# Patient Record
Sex: Female | Born: 1935 | Race: White | Hispanic: No | Marital: Married | Smoking: Never smoker
Health system: Southern US, Community
[De-identification: ages and names within clinical notes are randomized; demographics above are authoritative.]

## PROBLEM LIST (undated history)

## (undated) DIAGNOSIS — H3530 Unspecified macular degeneration: Secondary | ICD-10-CM

## (undated) DIAGNOSIS — E039 Hypothyroidism, unspecified: Secondary | ICD-10-CM

## (undated) DIAGNOSIS — I4891 Unspecified atrial fibrillation: Secondary | ICD-10-CM

## (undated) DIAGNOSIS — H16139 Photokeratitis, unspecified eye: Secondary | ICD-10-CM

## (undated) DIAGNOSIS — C801 Malignant (primary) neoplasm, unspecified: Secondary | ICD-10-CM

## (undated) DIAGNOSIS — R0902 Hypoxemia: Secondary | ICD-10-CM

## (undated) DIAGNOSIS — E7800 Pure hypercholesterolemia, unspecified: Secondary | ICD-10-CM

## (undated) DIAGNOSIS — C4442 Squamous cell carcinoma of skin of scalp and neck: Secondary | ICD-10-CM

## (undated) DIAGNOSIS — I447 Left bundle-branch block, unspecified: Secondary | ICD-10-CM

## (undated) HISTORY — PX: MASTECTOMY: SHX3

## (undated) HISTORY — PX: PERCUTANEOUS PINNING FEMORAL NECK FRACTURE: SUR1014

---

## 1995-05-19 HISTORY — PX: VALVE REPLACEMENT: SUR13

## 1998-06-23 ENCOUNTER — Other Ambulatory Visit: Admission: RE | Admit: 1998-06-23 | Discharge: 1998-06-23 | Payer: Self-pay | Admitting: Cardiology

## 2000-06-16 ENCOUNTER — Encounter: Payer: Self-pay | Admitting: General Surgery

## 2000-06-16 ENCOUNTER — Encounter: Admission: RE | Admit: 2000-06-16 | Discharge: 2000-06-16 | Payer: Self-pay | Admitting: General Surgery

## 2001-06-18 ENCOUNTER — Encounter: Admission: RE | Admit: 2001-06-18 | Discharge: 2001-06-18 | Payer: Self-pay | Admitting: General Surgery

## 2001-06-18 ENCOUNTER — Encounter: Payer: Self-pay | Admitting: General Surgery

## 2002-06-24 ENCOUNTER — Encounter: Admission: RE | Admit: 2002-06-24 | Discharge: 2002-06-24 | Payer: Self-pay | Admitting: Family Medicine

## 2002-06-24 ENCOUNTER — Encounter: Payer: Self-pay | Admitting: Family Medicine

## 2003-06-19 ENCOUNTER — Encounter: Payer: Self-pay | Admitting: Family Medicine

## 2003-06-19 ENCOUNTER — Encounter: Admission: RE | Admit: 2003-06-19 | Discharge: 2003-06-19 | Payer: Self-pay | Admitting: Family Medicine

## 2004-06-22 ENCOUNTER — Encounter: Admission: RE | Admit: 2004-06-22 | Discharge: 2004-06-22 | Payer: Self-pay | Admitting: Family Medicine

## 2005-07-05 ENCOUNTER — Encounter: Admission: RE | Admit: 2005-07-05 | Discharge: 2005-07-05 | Payer: Self-pay | Admitting: Family Medicine

## 2006-07-10 ENCOUNTER — Encounter: Admission: RE | Admit: 2006-07-10 | Discharge: 2006-07-10 | Payer: Self-pay | Admitting: Family Medicine

## 2006-10-21 ENCOUNTER — Encounter: Payer: Self-pay | Admitting: *Deleted

## 2006-10-22 ENCOUNTER — Inpatient Hospital Stay (HOSPITAL_COMMUNITY): Admission: EM | Admit: 2006-10-22 | Discharge: 2006-10-30 | Payer: Self-pay | Admitting: Orthopaedic Surgery

## 2013-08-16 ENCOUNTER — Inpatient Hospital Stay (HOSPITAL_COMMUNITY)
Admission: EM | Admit: 2013-08-16 | Discharge: 2013-08-19 | DRG: 291 | Disposition: A | Discharge status: Skilled Nursing Facility | Payer: Medicare Other | Attending: Internal Medicine | Admitting: Internal Medicine

## 2013-08-16 ENCOUNTER — Emergency Department (HOSPITAL_COMMUNITY): Payer: Medicare Other

## 2013-08-16 ENCOUNTER — Encounter (HOSPITAL_COMMUNITY): Payer: Self-pay | Admitting: *Deleted

## 2013-08-16 DIAGNOSIS — R0902 Hypoxemia: Secondary | ICD-10-CM

## 2013-08-16 DIAGNOSIS — R0609 Other forms of dyspnea: Secondary | ICD-10-CM

## 2013-08-16 DIAGNOSIS — Z853 Personal history of malignant neoplasm of breast: Secondary | ICD-10-CM

## 2013-08-16 DIAGNOSIS — Z79899 Other long term (current) drug therapy: Secondary | ICD-10-CM

## 2013-08-16 DIAGNOSIS — I359 Nonrheumatic aortic valve disorder, unspecified: Secondary | ICD-10-CM

## 2013-08-16 DIAGNOSIS — Z66 Do not resuscitate: Secondary | ICD-10-CM | POA: Diagnosis present

## 2013-08-16 DIAGNOSIS — R0600 Dyspnea, unspecified: Secondary | ICD-10-CM

## 2013-08-16 DIAGNOSIS — E039 Hypothyroidism, unspecified: Secondary | ICD-10-CM | POA: Diagnosis present

## 2013-08-16 DIAGNOSIS — Z7401 Bed confinement status: Secondary | ICD-10-CM

## 2013-08-16 DIAGNOSIS — J9600 Acute respiratory failure, unspecified whether with hypoxia or hypercapnia: Secondary | ICD-10-CM | POA: Diagnosis present

## 2013-08-16 DIAGNOSIS — I4891 Unspecified atrial fibrillation: Secondary | ICD-10-CM | POA: Diagnosis present

## 2013-08-16 DIAGNOSIS — D72829 Elevated white blood cell count, unspecified: Secondary | ICD-10-CM

## 2013-08-16 DIAGNOSIS — E876 Hypokalemia: Secondary | ICD-10-CM | POA: Diagnosis present

## 2013-08-16 DIAGNOSIS — I447 Left bundle-branch block, unspecified: Secondary | ICD-10-CM | POA: Diagnosis present

## 2013-08-16 DIAGNOSIS — N390 Urinary tract infection, site not specified: Secondary | ICD-10-CM | POA: Diagnosis present

## 2013-08-16 DIAGNOSIS — F0390 Unspecified dementia without behavioral disturbance: Secondary | ICD-10-CM | POA: Diagnosis present

## 2013-08-16 DIAGNOSIS — E7800 Pure hypercholesterolemia, unspecified: Secondary | ICD-10-CM | POA: Diagnosis present

## 2013-08-16 DIAGNOSIS — I509 Heart failure, unspecified: Principal | ICD-10-CM | POA: Diagnosis present

## 2013-08-16 DIAGNOSIS — Z7901 Long term (current) use of anticoagulants: Secondary | ICD-10-CM

## 2013-08-16 DIAGNOSIS — I059 Rheumatic mitral valve disease, unspecified: Secondary | ICD-10-CM | POA: Diagnosis present

## 2013-08-16 HISTORY — DX: Hypothyroidism, unspecified: E03.9

## 2013-08-16 HISTORY — DX: Pure hypercholesterolemia, unspecified: E78.00

## 2013-08-16 HISTORY — DX: Left bundle-branch block, unspecified: I44.7

## 2013-08-16 HISTORY — DX: Hypoxemia: R09.02

## 2013-08-16 HISTORY — DX: Photokeratitis, unspecified eye: H16.139

## 2013-08-16 HISTORY — DX: Unspecified macular degeneration: H35.30

## 2013-08-16 HISTORY — DX: Malignant (primary) neoplasm, unspecified: C80.1

## 2013-08-16 HISTORY — DX: Unspecified atrial fibrillation: I48.91

## 2013-08-16 LAB — URINALYSIS, ROUTINE W REFLEX MICROSCOPIC
Bilirubin Urine: NEGATIVE
Nitrite: NEGATIVE
Protein, ur: NEGATIVE mg/dL
Urobilinogen, UA: 0.2 mg/dL (ref 0.0–1.0)

## 2013-08-16 LAB — CBC WITH DIFFERENTIAL/PLATELET
Basophils Absolute: 0 10*3/uL (ref 0.0–0.1)
Basophils Relative: 0 % (ref 0–1)
Eosinophils Absolute: 0.2 10*3/uL (ref 0.0–0.7)
Hemoglobin: 11.7 g/dL — ABNORMAL LOW (ref 12.0–15.0)
Lymphocytes Relative: 42 % (ref 12–46)
MCHC: 33.3 g/dL (ref 30.0–36.0)
Monocytes Relative: 7 % (ref 3–12)
Neutrophils Relative %: 50 % (ref 43–77)
RDW: 15.1 % (ref 11.5–15.5)
WBC: 15 10*3/uL — ABNORMAL HIGH (ref 4.0–10.5)

## 2013-08-16 LAB — URINE MICROSCOPIC-ADD ON

## 2013-08-16 LAB — BASIC METABOLIC PANEL
Chloride: 101 mEq/L (ref 96–112)
GFR calc Af Amer: 60 mL/min — ABNORMAL LOW (ref >90)
GFR calc non Af Amer: 52 mL/min — ABNORMAL LOW (ref >90)
Potassium: 3.1 mEq/L — ABNORMAL LOW (ref 3.5–5.1)
Sodium: 139 mEq/L (ref 135–145)

## 2013-08-16 LAB — DIGOXIN LEVEL: Digoxin Level: 0.6 ng/mL — ABNORMAL LOW (ref 0.8–2.0)

## 2013-08-16 LAB — PROTIME-INR: INR: 2.26 — ABNORMAL HIGH (ref 0.00–1.49)

## 2013-08-16 LAB — POCT I-STAT TROPONIN I: Troponin i, poc: 0.03 ng/mL (ref 0.00–0.08)

## 2013-08-16 MED ORDER — POLYETHYLENE GLYCOL 3350 17 G PO PACK
17.0000 g | PACK | Freq: Every day | ORAL | Status: DC
  Administered 2013-08-16 – 2013-08-19 (×4): 17 g via ORAL
  Filled 2013-08-16 (×4): qty 1

## 2013-08-16 MED ORDER — WARFARIN SODIUM 5 MG PO TABS
5.0000 mg | ORAL_TABLET | Freq: Once | ORAL | Status: DC
  Filled 2013-08-16: qty 1

## 2013-08-16 MED ORDER — WARFARIN - PHARMACIST DOSING INPATIENT
Freq: Every day | Status: DC
  Administered 2013-08-16: 18:00:00

## 2013-08-16 MED ORDER — DEXTROSE 5 % IV SOLN
1.0000 g | INTRAVENOUS | Status: DC
  Administered 2013-08-16 – 2013-08-18 (×3): 1 g via INTRAVENOUS
  Filled 2013-08-16 (×4): qty 10

## 2013-08-16 MED ORDER — WARFARIN SODIUM 4 MG PO TABS
4.0000 mg | ORAL_TABLET | ORAL | Status: DC
Start: 2013-08-17 — End: 2013-08-17
  Filled 2013-08-16: qty 1

## 2013-08-16 MED ORDER — WARFARIN - PHYSICIAN DOSING INPATIENT: Freq: Every day | Status: DC

## 2013-08-16 MED ORDER — CITALOPRAM HYDROBROMIDE 20 MG PO TABS
20.0000 mg | ORAL_TABLET | Freq: Every day | ORAL | Status: DC
  Administered 2013-08-16 – 2013-08-19 (×4): 20 mg via ORAL
  Filled 2013-08-16 (×4): qty 1

## 2013-08-16 MED ORDER — SODIUM CHLORIDE 0.9 % IV SOLN: 250.0000 mL | INTRAVENOUS | Status: DC | PRN

## 2013-08-16 MED ORDER — DIGOXIN 125 MCG PO TABS
0.1250 mg | ORAL_TABLET | Freq: Every day | ORAL | Status: DC
  Administered 2013-08-16 – 2013-08-19 (×4): 0.125 mg via ORAL
  Filled 2013-08-16 (×4): qty 1

## 2013-08-16 MED ORDER — OCUVITE PO TABS
1.0000 | ORAL_TABLET | Freq: Every day | ORAL | Status: DC
  Administered 2013-08-16 – 2013-08-19 (×4): 1 via ORAL
  Filled 2013-08-16 (×4): qty 1

## 2013-08-16 MED ORDER — ONDANSETRON HCL 4 MG/2ML IJ SOLN: 4.0000 mg | Freq: Four times a day (QID) | INTRAMUSCULAR | Status: DC | PRN

## 2013-08-16 MED ORDER — SODIUM CHLORIDE 0.9 % IJ SOLN: 3.0000 mL | INTRAMUSCULAR | Status: DC | PRN

## 2013-08-16 MED ORDER — BISACODYL 5 MG PO TBEC: 5.0000 mg | DELAYED_RELEASE_TABLET | Freq: Every day | ORAL | Status: DC | PRN

## 2013-08-16 MED ORDER — SODIUM CHLORIDE 0.9 % IJ SOLN
3.0000 mL | Freq: Two times a day (BID) | INTRAMUSCULAR | Status: DC
  Administered 2013-08-16 – 2013-08-19 (×5): 3 mL via INTRAVENOUS

## 2013-08-16 MED ORDER — DOCUSATE SODIUM 100 MG PO CAPS
200.0000 mg | ORAL_CAPSULE | Freq: Every day | ORAL | Status: DC
  Administered 2013-08-16 – 2013-08-19 (×3): 200 mg via ORAL
  Filled 2013-08-16 (×5): qty 2

## 2013-08-16 MED ORDER — ALUM & MAG HYDROXIDE-SIMETH 200-200-20 MG/5ML PO SUSP: 30.0000 mL | Freq: Four times a day (QID) | ORAL | Status: DC | PRN

## 2013-08-16 MED ORDER — DEXTROSE 5 % IV SOLN
500.0000 mg | INTRAVENOUS | Status: DC
  Administered 2013-08-16 – 2013-08-17 (×2): 500 mg via INTRAVENOUS
  Filled 2013-08-16 (×2): qty 500

## 2013-08-16 MED ORDER — ONDANSETRON HCL 4 MG PO TABS: 4.0000 mg | ORAL_TABLET | Freq: Four times a day (QID) | ORAL | Status: DC | PRN

## 2013-08-16 MED ORDER — SODIUM CHLORIDE 0.9 % IJ SOLN
3.0000 mL | Freq: Two times a day (BID) | INTRAMUSCULAR | Status: DC
  Administered 2013-08-16 – 2013-08-17 (×3): 3 mL via INTRAVENOUS

## 2013-08-16 MED ORDER — LEVOTHYROXINE SODIUM 125 MCG PO TABS
125.0000 ug | ORAL_TABLET | Freq: Every day | ORAL | Status: DC
  Administered 2013-08-16 – 2013-08-19 (×4): 125 ug via ORAL
  Filled 2013-08-16 (×5): qty 1

## 2013-08-16 MED ORDER — FUROSEMIDE 10 MG/ML IJ SOLN
20.0000 mg | Freq: Every day | INTRAMUSCULAR | Status: DC
  Administered 2013-08-16 – 2013-08-17 (×2): 20 mg via INTRAVENOUS
  Filled 2013-08-16 (×3): qty 2

## 2013-08-16 MED ORDER — ACETAMINOPHEN 500 MG PO TABS
500.0000 mg | ORAL_TABLET | Freq: Four times a day (QID) | ORAL | Status: DC | PRN
  Administered 2013-08-19: 500 mg via ORAL
  Filled 2013-08-16 (×2): qty 1

## 2013-08-16 MED ORDER — WARFARIN SODIUM 3 MG PO TABS
3.0000 mg | ORAL_TABLET | ORAL | Status: DC
  Administered 2013-08-16: 3 mg via ORAL
  Filled 2013-08-16 (×2): qty 1

## 2013-08-16 NOTE — Progress Notes (Signed)
  Echocardiogram 2D Echocardiogram has been performed.  Georgian Co 08/16/2013, 5:37 PM

## 2013-08-16 NOTE — Progress Notes (Signed)
**   LATE ENTRY **   ANTICOAGULATION CONSULT NOTE - Initial Consult  Pharmacy Consult:  Coumadin Indication:  Afib  No Known Allergies  Patient Measurements: Height: 4\' 7"  (139.7 cm) Weight: 137 lb 2 oz (62.2 kg) (Bedweight) IBW/kg (Calculated) : 34  Vital Signs: Temp: 97.6 F (36.4 C) (08/29 1503) Temp src: Oral (08/29 1503) BP: 122/67 mmHg (08/29 1503) Pulse Rate: 102 (08/29 1503)  Labs:  Recent Labs  08/16/13 0625  HGB 11.7*  HCT 35.1*  PLT 244  LABPROT 24.2*  INR 2.26*  CREATININE 0.92    Estimated Creatinine Clearance: 26.7 ml/min (by C-G formula based on Cr of 0.92).   Medical History: Past Medical History  Diagnosis Date  . Hypercholesteremia   . Atrial fibrillation   . Left bundle branch block   . Macular degeneration of left eye   . Actinic keratitis   . Cancer   . Hypothyroidism   . Hypoxia 08/16/2013      Assessment: 89 YOF with Afib to continue on Coumadin from [facility]TA.  Patient's INR is therapeutic; no bleeding reported.   Goal of Therapy:  INR 2-3 Monitor platelets by anticoagulation protocol: Yes    Plan:  - Coumadin 3mg  alternating with 4mg  PO daily at 1800 ==> 3mg  today - Daily PT / INR     Aaria Happ D. Laney Potash, PharmD, BCPS Pager:  [phone] 08/16/2013, 6:03 PM

## 2013-08-16 NOTE — Progress Notes (Signed)
Patient has arrived on the unit from ED; report received and patient is stable; will continue to monitor patient.  Lorretta Harp RN

## 2013-08-16 NOTE — ED Provider Notes (Signed)
CSN: [id number]     Arrival date & time 08/16/13  0865 History   First MD Initiated Contact with Patient 08/16/13 [phone]     Chief Complaint  Patient presents with  . Respiratory Distress   (Consider location/radiation/quality/duration/timing/severity/associated sxs/prior Treatment) HPI 79 yo female presents to the ER from nursing home via EMS with complaint of shortness of breath, hypoxia.  Nursing home staff told EMS when they were making morning rounds they found her wheezing with 02 sat of 79%.  Pt was on 4L upon EMS arrival.  Pt received albuterol/atrovent neb x 2 and iv solumedrol.  No prior h/o chf, copd.  Family reports last time patient had trouble with breathing was about 18 years ago when she had valve problems.  Pt denies fever, cough, chest pain.  She has chronic swelling of both legs.   Past Medical History  Diagnosis Date  . Hypothyroidism   . Hypercholesteremia   . Atrial fibrillation   . Left bundle branch block   . Macular degeneration of left eye   . Actinic keratitis   . Cancer    Past Surgical History  Procedure Laterality Date  . Mastectomy    . Percutaneous pinning femoral neck fracture     Family History  Problem Relation Age of Onset  . Diabetes Father    History  Substance Use Topics  . Smoking status: Never Smoker   . Smokeless tobacco: Never Used  . Alcohol Use: No   OB History   Grav Para Term Preterm Abortions TAB SAB Ect Mult Living                 Review of Systems  All other systems reviewed and are negative.    Allergies  Review of patient's allergies indicates no known allergies.  Home Medications   Current Outpatient Rx  Name  Route  Sig  Dispense  Refill  . acetaminophen (TYLENOL) 500 MG tablet   Oral   Take 500 mg by mouth every 6 (six) hours as needed for pain.         . beta carotene w/minerals (OCUVITE) tablet   Oral   Take 1 tablet by mouth daily.         . Calcium Carbonate (OS-CAL PO)   Oral   Take 1 tablet by  mouth daily.         . citalopram (CELEXA) 20 MG tablet   Oral   Take 20 mg by mouth daily.         . Cranberry-Vitamin C-Inulin (UTI-STAT) LIQD   Oral   Take 30 mLs by mouth daily.         . digoxin (LANOXIN) 0.125 MG tablet   Oral   Take 0.125 mg by mouth daily.         Marland Kitchen docusate sodium (COLACE) 100 MG capsule   Oral   Take 200 mg by mouth at bedtime.         Marland Kitchen levothyroxine (SYNTHROID, LEVOTHROID) 125 MCG tablet   Oral   Take 125 mcg by mouth daily before breakfast.         . polyethylene glycol (MIRALAX / GLYCOLAX) packet   Oral   Take 17 g by mouth daily.         Marland Kitchen warfarin (COUMADIN) 3 MG tablet   Oral   Take 3 mg by mouth every other day.         . warfarin (COUMADIN) 4 MG tablet   Oral  Take 4 mg by mouth every other day.          BP 103/49  Pulse 106  Temp(Src) 98 F (36.7 C) (Axillary)  Resp 24  Ht 4\' 7"  (1.397 m)  Wt 148 lb (67.132 kg)  BMI 34.4 kg/m2  SpO2 94% Physical Exam  Constitutional: She is oriented to person, place, and time. She appears well-developed and well-nourished.  HENT:  Head: Normocephalic and atraumatic.  Nose: Nose normal.  Mouth/Throat: Oropharynx is clear and moist.  Eyes: Conjunctivae and EOM are normal. Pupils are equal, round, and reactive to light.  Neck: Normal range of motion. Neck supple. No JVD present. No tracheal deviation present. No thyromegaly present.  Cardiovascular: Normal rate, regular rhythm and intact distal pulses.  Exam reveals no gallop and no friction rub.   Murmur heard. Pulmonary/Chest: Effort normal. No stridor. No respiratory distress. She has wheezes. She has rales. She exhibits no tenderness.  Abdominal: Soft. Bowel sounds are normal. She exhibits no distension and no mass. There is no tenderness. There is no rebound and no guarding.  Musculoskeletal: Normal range of motion. She exhibits edema. She exhibits no tenderness.  Lymphadenopathy:    She has no cervical adenopathy.   Neurological: She is alert and oriented to person, place, and time. She exhibits normal muscle tone. Coordination normal.  Skin: Skin is warm and dry. No rash noted. No erythema. No pallor.  Psychiatric: She has a normal mood and affect. Her behavior is normal. Judgment and thought content normal.    ED Course  Procedures (including critical care time) Labs Review Labs Reviewed  CBC WITH DIFFERENTIAL - Abnormal; Notable for the following:    WBC 15.0 (*)    Hemoglobin 11.7 (*)    HCT 35.1 (*)    Lymphs Abs 6.3 (*)    Monocytes Absolute 1.1 (*)    All other components within normal limits  BASIC METABOLIC PANEL - Abnormal; Notable for the following:    Potassium 3.1 (*)    Glucose, Bld 161 (*)    GFR calc non Af Amer 52 (*)    GFR calc Af Amer 60 (*)    All other components within normal limits  PROTIME-INR - Abnormal; Notable for the following:    Prothrombin Time 24.2 (*)    INR 2.26 (*)    All other components within normal limits  PRO B NATRIURETIC PEPTIDE  POCT I-STAT TROPONIN I   Imaging Review Dg Chest 2 View  08/16/2013   *RADIOLOGY REPORT*  Clinical Data: Shortness of breath  CHEST - 2 VIEW  Comparison: 08/16/2013 05:48 hours  Findings: The cardiac shadow is stable.  Postsurgical changes are again noted.  The lungs are well-aerated bilaterally.  Diffuse interstitial changes are again identified.  Prominence in the medial aspect of the right lung apex is related to patient rotation to the right.  No acute abnormality is noted.  IMPRESSION: No significant change from earlier in the same day.   Original Report Authenticated By: Alcide Clever, M.D.   Dg Chest Port 1 View  08/16/2013   *RADIOLOGY REPORT*  Clinical Data: Shortness of breath  PORTABLE CHEST - 1 VIEW  Comparison: 10/21/2006  Findings: Degraded by rotation which can obscure a mediastinal process.  Within this limitation, cardiomediastinal contours are similar to prior with mild cardiac enlargement. Aortic valve  replacement.  Aortic atherosclerosis.  Interstitial coarsening has progressed from 2007.  Mild lung base opacities, favor scarring. Unable to exclude small effusions secondary to hemidiaphragm  elevation.  No pneumothorax.  Surgical clips right axilla. Osteopenia.  High-riding humeral heads.  IMPRESSION: Since 2007, increase in interstitial coarsening, at least in part favored to reflect progression of interstitial lung disease/fibrosis. A superimposed acute interstitial process, such as atypical infection or interstitial edema is not excluded.  Mild bibasilar opacities, favor scarring.   Original Report Authenticated By: Jearld Lesch, M.D.    Date: 08/16/2013  Rate: 92  Rhythm: atrial fibrillation  QRS Axis: left  Intervals: afib  ST/T Wave abnormalities: ST depressions laterally  Conduction Disutrbances:left bundle branch block  Narrative Interpretation:   Old EKG Reviewed: changes noted    MDM   1. Dyspnea   2. Hypoxia   3. Leukocytosis    79 yo female with acute sob, wheezing, rales on exam.  One lead with concordant ST depression, but in lead 5, does not meet Sgarbossa criteria.  Pt is improved after neb tx.  Will get labs, plan for admission given age, hypoxia    Olivia Mackie, MD 08/16/13 [phone]

## 2013-08-16 NOTE — ED Notes (Addendum)
Patient arrived via EMS from [facility] with c/o SOB.  Denies SOB, etc.  EMS adm 2 nebs and on is continuing at this time.  Has 3-4+ pitting edema to lower extremeties. States this is normal for her

## 2013-08-16 NOTE — ED Notes (Signed)
Report called but no admit orders placed.

## 2013-08-16 NOTE — Progress Notes (Signed)
[facility] History and Physical  Rebecca Estrada ZOX:[id number] DOB: 1917/10/17 DOA: 08/16/2013  Referring physician: Dr. Norlene Campbell PCP: No primary provider on file.    Chief Complaint: Shortness of breath  HPI: Rebecca Estrada is a 79 y.o. female  Rebecca Estrada is a pleasant 79 year old female with a past medical history of atrial for ablation, currently on anticoagulation with Coumadin who is a nursing home resident at [facility] [place]. She was transferred to the emergency department this morning where patient was found to be in acute respiratory distress, found to have O2 sats of 79% at her facility. Patient's requiring 4 L supplemental oxygen via nasal cannula to maintain sats in the 90s. Was administered due to meds as well as IV Solu-Medrol in the emergency department. Family members reporting a history of chronic hypoxemic respiratory failure as she does not require supplemental oxygen at baseline. They state that patient overall has done well up to this point without any recent issues or recent hospitalizations. She has not been noted to have cough and shortness of breath chest pain fevers chills nausea or vomiting. She does have a history of chronic bilateral extremity pitting edema. During my catheter she is in no acute distress, now on 2 L supplemental oxygen, breathing comfortably. I do not know the utilization of accessory muscles. She tells me she is feeling fine and is requesting lunch. She has a history of advanced dementia and is bedbound. In the emergency room she was found to have an elevated white count of 15,000 with a chest x-ray not revealing an acute infiltrate. Urinalysis is pending at the time of this dictation. History was obtained from family members who were present at bedside as patient is unable to provide a reliable history due to underlying dementia.  Review of Systems: Patient with history of advanced dementia, unable to provide a reliable review of systems.  Past  Medical History  Diagnosis Date  . Hypercholesteremia   . Atrial fibrillation   . Left bundle branch block   . Macular degeneration of left eye   . Actinic keratitis   . Cancer   . Hypothyroidism    Past Surgical History  Procedure Laterality Date  . Mastectomy    . Percutaneous pinning femoral neck fracture     Social History:  reports that she has never smoked. She has never used smokeless tobacco. She reports that she does not drink alcohol or use illicit drugs. Patient is widowed, presently a resident at [facility]. She requires assistance with all activities of daily living. At baseline she is bedbound, confused and disoriented. CODE STATUS: DO NOT RESUSCITATE  No Known Allergies  Family History  Problem Relation Age of Onset  . Diabetes Father     Prior to Admission medications   Medication Sig Start Date End Date Taking? Authorizing Provider  acetaminophen (TYLENOL) 500 MG tablet Take 500 mg by mouth every 6 (six) hours as needed for pain.   Yes Historical Provider, MD  beta carotene w/minerals (OCUVITE) tablet Take 1 tablet by mouth daily.   Yes Historical Provider, MD  Calcium Carbonate (OS-CAL PO) Take 1 tablet by mouth daily.   Yes Historical Provider, MD  citalopram (CELEXA) 20 MG tablet Take 20 mg by mouth daily.   Yes Historical Provider, MD  Cranberry-Vitamin C-Inulin (UTI-STAT) LIQD Take 30 mLs by mouth daily.   Yes Historical Provider, MD  digoxin (LANOXIN) 0.125 MG tablet Take 0.125 mg by mouth daily.   Yes  Historical Provider, MD  docusate sodium (COLACE) 100 MG capsule Take 200 mg by mouth at bedtime.   Yes Historical Provider, MD  levothyroxine (SYNTHROID, LEVOTHROID) 125 MCG tablet Take 125 mcg by mouth daily before breakfast.   Yes Historical Provider, MD  polyethylene glycol (MIRALAX / GLYCOLAX) packet Take 17 g by mouth daily.   Yes Historical Provider, MD  warfarin (COUMADIN) 3 MG tablet Take 3 mg by mouth every other day.   Yes  Historical Provider, MD  warfarin (COUMADIN) 4 MG tablet Take 4 mg by mouth every other day.   Yes Historical Provider, MD   Physical Exam: Filed Vitals:   08/16/13 0943  BP: 135/65  Pulse: 101  Temp: 97.5 F (36.4 C)  Resp: 20     General:  Patient is in no acute distress she is awake alert pleasant though confused and disoriented.  Eyes: Pupils are equal round reactive to light extraocular movement intact no sclera icterus  ENT: Neck is symmetrical however there was jugular venous distention noted  Neck: Positive JVD, supple symmetrical otherwise  Cardiovascular: 3/6 systolic ejection murmur otherwise irregular rate and rhythm normal S1-S2, she has 2+ bilateral extremity pitting edema  Respiratory: Patient presently on 2 L supplemental oxygen, breathing comfortably. I noted the presence of by basilar crackles normal respiratory  Abdomen: Her abdomen is soft nontender nondistended positive bowel sounds in all 4 quadrants  Skin: Skin is intact no rashes or lesions  Musculoskeletal: Present range of motion of all extremities  Psychiatric: Patient with history of dementia, confused disoriented however quite pleasant  Neurologic: Cranial nerve 2 the total grossly intact no alteration to sensation 5 out of 5 muscle strength  Labs on Admission:  Basic Metabolic Panel:  Recent Labs Lab 08/16/13 0625  NA 139  K 3.1*  CL 101  CO2 26  GLUCOSE 161*  BUN 17  CREATININE 0.92  CALCIUM 9.2   Liver Function Tests: No results found for this basename: AST, ALT, ALKPHOS, BILITOT, PROT, ALBUMIN,  in the last 168 hours No results found for this basename: LIPASE, AMYLASE,  in the last 168 hours No results found for this basename: AMMONIA,  in the last 168 hours CBC:  Recent Labs Lab 08/16/13 0625  WBC 15.0*  NEUTROABS 7.4  HGB 11.7*  HCT 35.1*  MCV 90.0  PLT 244   Cardiac Enzymes: No results found for this basename: CKTOTAL, CKMB, CKMBINDEX, TROPONINI,  in the last 168  hours  BNP (last 3 results)  Recent Labs  08/16/13 0522  PROBNP 1830.0*   CBG: No results found for this basename: GLUCAP,  in the last 168 hours  Radiological Exams on Admission: Dg Chest 2 View  08/16/2013   *RADIOLOGY REPORT*  Clinical Data: Shortness of breath  CHEST - 2 VIEW  Comparison: 08/16/2013 05:48 hours  Findings: The cardiac shadow is stable.  Postsurgical changes are again noted.  The lungs are well-aerated bilaterally.  Diffuse interstitial changes are again identified.  Prominence in the medial aspect of the right lung apex is related to patient rotation to the right.  No acute abnormality is noted.  IMPRESSION: No significant change from earlier in the same day.   Original Report Authenticated By: Alcide Clever, M.D.   Dg Chest Port 1 View  08/16/2013   *RADIOLOGY REPORT*  Clinical Data: Shortness of breath  PORTABLE CHEST - 1 VIEW  Comparison: 10/21/2006  Findings: Degraded by rotation which can obscure a mediastinal process.  Within this limitation, cardiomediastinal contours  are similar to prior with mild cardiac enlargement. Aortic valve replacement.  Aortic atherosclerosis.  Interstitial coarsening has progressed from 2007.  Mild lung base opacities, favor scarring. Unable to exclude small effusions secondary to hemidiaphragm elevation.  No pneumothorax.  Surgical clips right axilla. Osteopenia.  High-riding humeral heads.  IMPRESSION: Since 2007, increase in interstitial coarsening, at least in part favored to reflect progression of interstitial lung disease/fibrosis. A superimposed acute interstitial process, such as atypical infection or interstitial edema is not excluded.  Mild bibasilar opacities, favor scarring.   Original Report Authenticated By: Jearld Lesch, M.D.    EKG: Independently reviewed.  Assessment/Plan Active Problems:   Leukocytosis   Dyspnea   Hypoxia   Atrial fibrillation   CHF (congestive heart failure)  Acute hypoxemic respiratory  failure Usual state of health until this morning found to be acutely hypoxemic. Acute hypoxemic respiratory failure evidenced by an O2 sat of 79% on room air with clinical presentation of respiratory distress. She does not have a history of chronic hypoxemic respiratory failure and does not require supplemental oxygen at baseline. I suspect acute hypoxemic respiratory failure secondary to acute decompensated heart failure as lab work in emergent apartment show an elevated BNP. Chest x-ray on presentation did not reveal obvious infiltrate She does not takes Lasix at home it's possible that underlying atrial fibrillation may have precipitated acute decompensated heart failure. Other possibilities include underlying infectious process such as healthcare associated pneumonia or acute bronchitis,  family members that reported a history of asthma or COPD. She appears improved during my encounter, we'll continue supplemental oxygen, starting Lasix 20 mg IV daily along with empiric antibiotic therapy. Will follow.   Suspected acute decompensated heart failure May have been precipitated by atrial fibrillation. Initial lab work showed a BNP of 1830. I will administer 20 mg of IV Lasix daily, monitor ins and outs as well as daily weights. Initial cardiac enzymes are negative. She currently denies chest discomfort. I have ordered a transthoracic echocardiogram.  Leukocytosis Patient presented with a white count of 15,000. She is afebrile, nontoxic appearing. Possibilities include underlying pneumonia or perhaps urinary tract infection. I am starting empiric antibody therapy with ceftriaxone 1 g IV every 24 hours as well as azithromycin 500 mg IV every 24 hours. Plan to repeat chest x-ray in a.m. Followup on UA and cultures.  Possible pneumonia. Patient presenting with acute hypoxemic respiratory failure as well as an elevated white count of 15,000. Starting empiric antibiotic therapy with ceftriaxone and Rocephin for  now. Initial chest x-ray did not reveal acute infiltrates. I plan to repeat chest x-ray in a.m. The meantime she is stable, afebrile, nontoxic appearing. Blood cultures at have been obtained.  Atrial fibrillation Patient presently has a pulse of 101. I will continue digoxin at 0.125 mcg by mouth daily placed on continuous cardiac monitoring  Chronic anticoagulation Patient on chronic anticoagulation for age of fibrillation, we'll continue her home regimen of warfarin, as she presents with a therapeutic INR. Consult pharmacy for warfarin dosing.  Hypokalemia She presents with a potassium of 3.1, will administer 40 mEq of by mouth potassium now  Fluids electrolytes nutrition Will start patient on a heart healthy diet, nectar thick liquids saline locked   DVT prophylaxis  Patient fully anticoagulated with warfarin    Code Status:  DO NOT RESUSCITATE  Family Communication:  I discussed plan with patient's family members who were present at bedside  Disposition Plan:  Given multiple comorbidities an elderly age, and his facial  require at least 2 night hospitalization.  Time spent:  70 minutes  Jeralyn Bennett [facility] Pager [phone]  If 7PM-7AM, please contact night-coverage [email or web address] Password [facility] 08/16/2013, 12:22 PM

## 2013-08-16 NOTE — Progress Notes (Signed)
Utilization review completed. Shirell Struthers, RN, BSN. 

## 2013-08-17 ENCOUNTER — Inpatient Hospital Stay (HOSPITAL_COMMUNITY): Payer: Medicare Other

## 2013-08-17 DIAGNOSIS — N390 Urinary tract infection, site not specified: Secondary | ICD-10-CM

## 2013-08-17 LAB — URINE CULTURE: Culture: NO GROWTH

## 2013-08-17 LAB — PROTIME-INR
INR: 2.87 — ABNORMAL HIGH (ref 0.00–1.49)
Prothrombin Time: 29.1 seconds — ABNORMAL HIGH (ref 11.6–15.2)

## 2013-08-17 LAB — BASIC METABOLIC PANEL
CO2: 27 mEq/L (ref 19–32)
Chloride: 100 mEq/L (ref 96–112)
GFR calc Af Amer: 51 mL/min — ABNORMAL LOW (ref >90)
Potassium: 4.4 mEq/L (ref 3.5–5.1)
Sodium: 139 mEq/L (ref 135–145)

## 2013-08-17 LAB — CBC
Hemoglobin: 10.6 g/dL — ABNORMAL LOW (ref 12.0–15.0)
MCH: 29.9 pg (ref 26.0–34.0)
Platelets: 241 10*3/uL (ref 150–400)
RBC: 3.55 MIL/uL — ABNORMAL LOW (ref 3.87–5.11)
WBC: 18.2 10*3/uL — ABNORMAL HIGH (ref 4.0–10.5)

## 2013-08-17 LAB — PATHOLOGIST SMEAR REVIEW

## 2013-08-17 MED ORDER — WARFARIN SODIUM 3 MG PO TABS
3.0000 mg | ORAL_TABLET | ORAL | Status: DC
  Administered 2013-08-17: 18:00:00 3 mg via ORAL
  Filled 2013-08-17: qty 1

## 2013-08-17 MED ORDER — FUROSEMIDE 20 MG PO TABS
20.0000 mg | ORAL_TABLET | Freq: Every day | ORAL | Status: DC
  Administered 2013-08-18 – 2013-08-19 (×2): 20 mg via ORAL
  Filled 2013-08-17 (×5): qty 1

## 2013-08-17 MED ORDER — WARFARIN SODIUM 4 MG PO TABS
4.0000 mg | ORAL_TABLET | ORAL | Status: DC
  Filled 2013-08-17: qty 1

## 2013-08-17 NOTE — Progress Notes (Signed)
ANTICOAGULATION CONSULT NOTE - Follow Up Consult  Pharmacy Consult for Coumadin Indication: atrial fibrillation  No Known Allergies  Patient Measurements: Height: 4\' 7"  (139.7 cm) Weight: 136 lb 14.5 oz (62.1 kg) IBW/kg (Calculated) : 34  Vital Signs: Temp: 98 F (36.7 C) (08/30 0433) Temp src: Oral (08/30 0433) BP: 134/71 mmHg (08/30 0433) Pulse Rate: 65 (08/30 0433)  Labs:  Recent Labs  08/16/13 0625 08/17/13 0414  HGB 11.7* 10.6*  HCT 35.1* 31.5*  PLT 244 241  LABPROT 24.2* 29.1*  INR 2.26* 2.87*  CREATININE 0.92 1.05    Estimated Creatinine Clearance: 23.4 ml/min (by C-G formula based on Cr of 1.05).   Assessment: 79 y/o female on chronic Coumadin for Afib. INR is therapeutic at 2.87 but may be trending up due to antibiotics. No bleeding noted, CBC is stable.  Goal of Therapy:  INR 2-3 Monitor platelets by anticoagulation protocol: Yes   Plan:  -Coumadin 3 mg PO tonight then resume 3 mg alternating with 4 mg -INR daily -Monitor for s/sx of bleeding  [facility], [address].D., BCPS Clinical Pharmacist Pager: [phone] 08/17/2013 2:14 PM

## 2013-08-17 NOTE — Progress Notes (Signed)
[facility] PROGRESS NOTE  Rebecca Estrada:[id number] DOB: [birth year] DOA: 08/16/2013 PCP: Ginette Otto, MD  Assessment/Plan  Acute hypoxemic respiratory Evidence by an O2 sat of 79% on room air, I suspect secondary to acute decompensated heart failure. Repeat chest x-ray this morning showing no change from yesterday. She has remained afebrile, nontoxic, clinically improved after receiving IV Lasix. She is presently on 2 L supplemental oxygen via nasal cannula.   Acute decompensated heart failure Pending report from the transthoracic echocardiogram performed yesterday evening. Will transition her to oral Lasix at 20 mg  daily. She appears improved on today's examination. He is presently chest pain-free, having a troponin within normal limits.   Urinary tract infection UA consistent with UTI, continue ceftriaxone 1 g IV every 24 hours. Followup on urine cultures and blood cultures.  Leukocytosis Likely secondary to urinary tract infection rather than healthcare associated pneumonia. As I mentioned above a repeat chest x-ray did not show development of infiltrates. I will discontinue azithromycin, and continue ceftriaxone at 1 g IV every 24 hours. Followup on urine cultures.  Chronic anticoagulation Pharmacy consulted for dosing of Coumadin.  Hypokalemia Resolved  Code Status: DO NOT RESUSCITATE Family Communication: I went over plan with patient's daughter present at bedside Disposition Plan: Follow up on urine cultures as well as transthoracic echocardiogram, meanwhile maintain patient on ceftriaxone    Antibiotics:  Ceftriaxone 1 g IV every 24 hours (started on 08/16/2013)  HPI/Subjective: Rebecca Estrada is a pleasant 79 year old female, presented to the department as a transfer from her skilled nursing facility for shortness of breath, chest x-ray not show an acute infiltrate, with lab work showing a white count in the 17,000 range. She was started on empiric  antibiotic with Rocephin and azithromycin. With her elevated BNP, I attributed symptoms to acute decompensated heart failure. Urinalysis consistent with urinary tract infection. Yesterday afternoon patient became a little agitated this morning family members reporting that she appears better. Patient during my catheter is pleasant and cooperative.  Objective: Filed Vitals:   08/17/13 0433  BP: 134/71  Pulse: 65  Temp: 98 F (36.7 C)  Resp: 22    Intake/Output Summary (Last 24 hours) at 08/17/13 1421 Last data filed at 08/17/13 1000  Gross per 24 hour  Intake    480 ml  Output    600 ml  Net   -120 ml   Filed Weights   08/16/13 0531 08/16/13 0943 08/17/13 0433  Weight: 67.132 kg (148 lb) 62.2 kg (137 lb 2 oz) 62.1 kg (136 lb 14.5 oz)    Exam:   General:  Patient is in no acute distress, awake alert, continues to be confused and disoriented.  Cardiovascular: Regular rate and rhythm 2/6 systolic ejection murmur  Respiratory: Lungs showing the presence of by basilar crackles, there may be some improvement since yesterday's exam  Abdomen: Abdomen is soft nontender nondistended positive bowel sounds  Musculoskeletal: Present range of motion of all extremities  Extremities: 2+ bilateral extremity pitting edema  Data Reviewed: Basic Metabolic Panel:  Recent Labs Lab 08/16/13 0625 08/17/13 0414  NA 139 139  K 3.1* 4.4  CL 101 100  CO2 26 27  GLUCOSE 161* 104*  BUN 17 22  CREATININE 0.92 1.05  CALCIUM 9.2 9.3   Liver Function Tests: No results found for this basename: AST, ALT, ALKPHOS, BILITOT, PROT, ALBUMIN,  in the last 168 hours No results found for this basename: LIPASE, AMYLASE,  in the last 168 hours No results found  for this basename: AMMONIA,  in the last 168 hours CBC:  Recent Labs Lab 08/16/13 0625 08/17/13 0414  WBC 15.0* 18.2*  NEUTROABS 7.4  --   HGB 11.7* 10.6*  HCT 35.1* 31.5*  MCV 90.0 88.7  PLT 244 241   Cardiac Enzymes: No results  found for this basename: CKTOTAL, CKMB, CKMBINDEX, TROPONINI,  in the last 168 hours BNP (last 3 results)  Recent Labs  08/16/13 0522  PROBNP 1830.0*   CBG: No results found for this basename: GLUCAP,  in the last 168 hours  Recent Results (from the past 240 hour(s))  MRSA PCR SCREENING     Status: None   Collection Time    08/16/13  1:16 PM      Result Value Range Status   MRSA by PCR NEGATIVE  NEGATIVE Final   Comment:            The GeneXpert MRSA Assay (FDA     approved for NASAL specimens     only), is one component of a     comprehensive MRSA colonization     surveillance program. It is not     intended to diagnose MRSA     infection nor to guide or     monitor treatment for     MRSA infections.     Studies: Dg Chest 2 View  08/16/2013   *RADIOLOGY REPORT*  Clinical Data: Shortness of breath  CHEST - 2 VIEW  Comparison: 08/16/2013 05:48 hours  Findings: The cardiac shadow is stable.  Postsurgical changes are again noted.  The lungs are well-aerated bilaterally.  Diffuse interstitial changes are again identified.  Prominence in the medial aspect of the right lung apex is related to patient rotation to the right.  No acute abnormality is noted.  IMPRESSION: No significant change from earlier in the same day.   Original Report Authenticated By: Alcide Clever, M.D.   Dg Chest Port 1 View  08/16/2013   *RADIOLOGY REPORT*  Clinical Data: Shortness of breath  PORTABLE CHEST - 1 VIEW  Comparison: 10/21/2006  Findings: Degraded by rotation which can obscure a mediastinal process.  Within this limitation, cardiomediastinal contours are similar to prior with mild cardiac enlargement. Aortic valve replacement.  Aortic atherosclerosis.  Interstitial coarsening has progressed from 2007.  Mild lung base opacities, favor scarring. Unable to exclude small effusions secondary to hemidiaphragm elevation.  No pneumothorax.  Surgical clips right axilla. Osteopenia.  High-riding humeral heads.   IMPRESSION: Since 2007, increase in interstitial coarsening, at least in part favored to reflect progression of interstitial lung disease/fibrosis. A superimposed acute interstitial process, such as atypical infection or interstitial edema is not excluded.  Mild bibasilar opacities, favor scarring.   Original Report Authenticated By: Jearld Lesch, M.D.    Scheduled Meds: . beta carotene w/minerals  1 tablet Oral Daily  . cefTRIAXone (ROCEPHIN)  IV  1 g Intravenous Q24H  . citalopram  20 mg Oral Daily  . digoxin  0.125 mg Oral Daily  . docusate sodium  200 mg Oral QHS  . furosemide  20 mg Oral Daily  . levothyroxine  125 mcg Oral QAC breakfast  . polyethylene glycol  17 g Oral Daily  . sodium chloride  3 mL Intravenous Q12H  . sodium chloride  3 mL Intravenous Q12H  . warfarin  3 mg Oral Q48H  . [START ON 08/18/2013] warfarin  4 mg Oral Q48H  . Warfarin - Pharmacist Dosing Inpatient   Does not apply [phone]  Continuous Infusions:   Active Problems:   Leukocytosis   Dyspnea   Hypoxia   Atrial fibrillation   CHF (congestive heart failure)    Time spent: 40 minutes    Jeralyn Bennett  [facility] Pager [phone]. If 7PM-7AM, please contact night-coverage at [email or web address], password [facility] 08/17/2013, 2:21 PM  LOS: 1 day

## 2013-08-18 LAB — CBC
Hemoglobin: 11.3 g/dL — ABNORMAL LOW (ref 12.0–15.0)
MCH: 30.6 pg (ref 26.0–34.0)
MCHC: 34.1 g/dL (ref 30.0–36.0)
RDW: 15.4 % (ref 11.5–15.5)

## 2013-08-18 LAB — BASIC METABOLIC PANEL
BUN: 26 mg/dL — ABNORMAL HIGH (ref 6–23)
Calcium: 9.2 mg/dL (ref 8.4–10.5)
Creatinine, Ser: 1.13 mg/dL — ABNORMAL HIGH (ref 0.50–1.10)
GFR calc Af Amer: 47 mL/min — ABNORMAL LOW (ref >90)
GFR calc non Af Amer: 40 mL/min — ABNORMAL LOW (ref >90)
Glucose, Bld: 92 mg/dL (ref 70–99)

## 2013-08-18 LAB — PROTIME-INR: Prothrombin Time: 29.4 seconds — ABNORMAL HIGH (ref 11.6–15.2)

## 2013-08-18 MED ORDER — CEFUROXIME AXETIL 250 MG PO TABS: 250.0000 mg | ORAL_TABLET | Freq: Two times a day (BID) | ORAL | Status: AC

## 2013-08-18 MED ORDER — FUROSEMIDE 20 MG PO TABS: 20.0000 mg | ORAL_TABLET | Freq: Every day | ORAL | Status: AC

## 2013-08-18 NOTE — Discharge Summary (Signed)
Physician Discharge Summary  Rebecca Estrada ZOX:[id number] DOB: 1917-05-11 DOA: 08/16/2013  PCP: Ginette Otto, MD  Admit date: 08/16/2013 Discharge date: 08/18/2013  Time spent: 40 minutes  Recommendations for Outpatient Follow-up:  1. Please follow up on daily weights, patient discharged on Lasix 20 mg by mouth daily x4 days. At that point I believe patient will need to be reassessed with regard to continuing diuretic therapy 2. Followup on basic metabolic panel early next week. Patient started on Lasix during this hospitalization, kidney function and less likely to be followed in  Discharge Diagnoses:  Active Problems:   Leukocytosis   Dyspnea   Hypoxia   Atrial fibrillation   CHF (congestive heart failure)   Discharge Condition: Stable  Diet recommendation: Heart healthy  Filed Weights   08/16/13 0943 08/17/13 0433 08/18/13 0454  Weight: 62.2 kg (137 lb 2 oz) 62.1 kg (136 lb 14.5 oz) 60.34 kg (133 lb 0.4 oz)    History of present illness:  Rebecca Estrada is a 79 y.o. female  Rebecca Estrada is a pleasant 79 year old female with a past medical history of atrial for ablation, currently on anticoagulation with Coumadin who is a nursing home resident at [facility] [place]. She was transferred to the emergency department this morning where patient was found to be in acute respiratory distress, found to have O2 sats of 79% at her facility. Patient's requiring 4 L supplemental oxygen via nasal cannula to maintain sats in the 90s. Was administered due to meds as well as IV Solu-Medrol in the emergency department. Family members reporting a history of chronic hypoxemic respiratory failure as she does not require supplemental oxygen at baseline. They state that patient overall has done well up to this point without any recent issues or recent hospitalizations. She has not been noted to have cough and shortness of breath chest pain fevers chills nausea or vomiting. She does have a history  of chronic bilateral extremity pitting edema. During my catheter she is in no acute distress, now on 2 L supplemental oxygen, breathing comfortably. I do not know the utilization of accessory muscles. She tells me she is feeling fine and is requesting lunch. She has a history of advanced dementia and is bedbound. In the emergency room she was found to have an elevated white count of 15,000 with a chest x-ray not revealing an acute infiltrate. Urinalysis is pending at the time of this dictation. History was obtained from family members who were present at bedside as patient is unable to provide a reliable history due to underlying dementia.   Hospital Course:   Patient was admitted to telemetry, as noted above she presented as a transfer from her skilled nursing facility where she was found to be in acute hypoxemic respiratory failure. Chest x-ray and lab work seemed to indicate acute decompensated heart failure, as BNP was elevated at 1,830 and chest x-ray showing increased interstitial markings. Prior to this hospitalization she has not been on diuretic therapy. I started Lasix 20 mg IV daily, check ins and outs and daily weights. She denied chest pain as troponin was within normal limits.   Patient also found to have an elevated white count on presentation at 15,000. Chest x-ray did not reveal an acute infiltrate. Urinalysis however did show the presence of leukocyte esterase as well as many bacteria. She was started empirically on ceftriaxone 1 g IV every 24 hours. Blood cultures and urine cultures remained sterile. Her white count trended down from 18,200 on 08/17/2013  to 11,800 on the day of discharge.   A transthoracic echocardiogram on this admission showed an ejection fraction of 65-70% without wall motion maladies. All prostatic valve was noted. She had a severely calcified anulus of mitral valve. Probable grade 2-3 dysfunction with increased filling pressures. By the day of discharge she did not  require supplemental oxygen any longer, as if she was satting 95% on room air. She reported feeling much better, was tolerating by mouth intake, remained afebrile and 8 soft and 16 and nontoxic. Patient was discharged in stable condition to a skilled nursing facility on 08/18/2013.  Procedures:  Transthoracic echocardiogram formed on 08/16/13 impression: Normal LV size and thickness with LVEF 65-70%. Diastolic function difficult to assess, suspect grade 2-3 dysfunction with increased filling pressures. Severe MAC with moderate mitral regurgitation. Moderate to severe left atrial and. Apparent bile prosthesis in aortic position with normal gradients and no perivalvular leak. Mild to moderate tricuspid regurgitation with peak AST 48 mmHg and CVP estimated at 8 mmHg.    Discharge Exam: Filed Vitals:   08/18/13 0936  BP: 137/56  Pulse: 89  Temp: 99.3 F (37.4 C)  Resp: 18    General: Patient is no acute distress she is awake alert, reports feeling much better today. Also supplemental oxygen Cardiovascular: Regular rate and rhythm normal S1-S2 no murmurs rubs or gallops Respiratory: Significant for lung exam with decreased crackles bilaterally. She is in no acute distress. Abdomen: Soft nontender nondistended positive bowel sounds positive in all 4 quadrant Extremities: 1-2+ bilateral extremity edema slight improvement since yesterday    Discharge Instructions  Discharge Orders   Future Orders Complete By Expires   (HEART FAILURE PATIENTS) Call MD:  Anytime you have any of the following symptoms: 1) 3 pound weight gain in 24 hours or 5 pounds in 1 week 2) shortness of breath, with or without a dry hacking cough 3) swelling in the hands, feet or stomach 4) if you have to sleep on extra pillows at night in order to breathe.  As directed    Call MD for:  difficulty breathing, headache or visual disturbances  As directed    Call MD for:  persistant dizziness or light-headedness  As directed     Call MD for:  persistant nausea and vomiting  As directed    Call MD for:  temperature >100.4  As directed    Diet - low sodium heart healthy  As directed    Increase activity slowly  As directed        Medication List         acetaminophen 500 MG tablet  Commonly known as:  TYLENOL  Take 500 mg by mouth every 6 (six) hours as needed for pain.     beta carotene w/minerals tablet  Take 1 tablet by mouth daily.     cefUROXime 250 MG tablet  Commonly known as:  CEFTIN  Take 1 tablet (250 mg total) by mouth 2 (two) times daily.     citalopram 20 MG tablet  Commonly known as:  CELEXA  Take 20 mg by mouth daily.     digoxin 0.125 MG tablet  Commonly known as:  LANOXIN  Take 0.125 mg by mouth daily.     docusate sodium 100 MG capsule  Commonly known as:  COLACE  Take 200 mg by mouth at bedtime.     furosemide 20 MG tablet  Commonly known as:  LASIX  Take 1 tablet (20 mg total) by mouth daily.  levothyroxine 125 MCG tablet  Commonly known as:  SYNTHROID, LEVOTHROID  Take 125 mcg by mouth daily before breakfast.     OS-CAL PO  Take 1 tablet by mouth daily.     polyethylene glycol packet  Commonly known as:  MIRALAX / GLYCOLAX  Take 17 g by mouth daily.     UTI-Stat Liqd  Take 30 mLs by mouth daily.     warfarin 3 MG tablet  Commonly known as:  COUMADIN  Take 3 mg by mouth every other day.     warfarin 4 MG tablet  Commonly known as:  COUMADIN  Take 4 mg by mouth every other day.       No Known Allergies     Follow-up Information   Follow up with Ginette Otto, MD In 1 week.   Specialty:  Internal Medicine   Contact information:   [address] [place] [address] [phone]        The results of significant diagnostics from this hospitalization (including imaging, microbiology, ancillary and laboratory) are listed below for reference.    Significant Diagnostic Studies: Dg Chest 2 View  08/17/2013   *RADIOLOGY REPORT*   Clinical Data: Hypoxemia  CHEST - 2 VIEW  Comparison: Prior radiograph from 08/16/2013.  Findings: Median sternotomy wires remain intact.  Cardiomegaly is stable as compared to the prior exam. Prosthetic valve again noted.  Current examination has been performed with a slightly improved degree of lung inflation.  Coarsening of the interstitial markings is not significantly changed as compared to the prior examination. Linear opacity at the medial right lung apex is unchanged.  There is no frank pulmonary edema. No new airspace consolidation identified.  No pneumothorax.  Osseous structures and soft tissues are unchanged. Surgical clips overlie the right axilla.  IMPRESSION: No significant interval change in the appearance of the heart and lungs with diffuse coarsening of the interstitial markings.  No new airspace consolidation identified.  No frank pulmonary edema.   Original Report Authenticated By: Rise Mu, M.D.   Dg Chest 2 View  08/16/2013   *RADIOLOGY REPORT*  Clinical Data: Shortness of breath  CHEST - 2 VIEW  Comparison: 08/16/2013 05:48 hours  Findings: The cardiac shadow is stable.  Postsurgical changes are again noted.  The lungs are well-aerated bilaterally.  Diffuse interstitial changes are again identified.  Prominence in the medial aspect of the right lung apex is related to patient rotation to the right.  No acute abnormality is noted.  IMPRESSION: No significant change from earlier in the same day.   Original Report Authenticated By: Alcide Clever, M.D.   Dg Chest Port 1 View  08/16/2013   *RADIOLOGY REPORT*  Clinical Data: Shortness of breath  PORTABLE CHEST - 1 VIEW  Comparison: 10/21/2006  Findings: Degraded by rotation which can obscure a mediastinal process.  Within this limitation, cardiomediastinal contours are similar to prior with mild cardiac enlargement. Aortic valve replacement.  Aortic atherosclerosis.  Interstitial coarsening has progressed from 2007.  Mild lung base  opacities, favor scarring. Unable to exclude small effusions secondary to hemidiaphragm elevation.  No pneumothorax.  Surgical clips right axilla. Osteopenia.  High-riding humeral heads.  IMPRESSION: Since 2007, increase in interstitial coarsening, at least in part favored to reflect progression of interstitial lung disease/fibrosis. A superimposed acute interstitial process, such as atypical infection or interstitial edema is not excluded.  Mild bibasilar opacities, favor scarring.   Original Report Authenticated By: Jearld Lesch, M.D.  Microbiology: Recent Results (from the past 240 hour(s))  MRSA PCR SCREENING     Status: None   Collection Time    08/16/13  1:16 PM      Result Value Range Status   MRSA by PCR NEGATIVE  NEGATIVE Final   Comment:            The GeneXpert MRSA Assay (FDA     approved for NASAL specimens     only), is one component of a     comprehensive MRSA colonization     surveillance program. It is not     intended to diagnose MRSA     infection nor to guide or     monitor treatment for     MRSA infections.  URINE CULTURE     Status: None   Collection Time    08/16/13  4:25 PM      Result Value Range Status   Specimen Description URINE, RANDOM   Final   Special Requests NONE   Final   Culture  Setup Time     Final   Value: 08/16/2013 17:42     Performed at [facility] Count     Final   Value: NO GROWTH     Performed at [facility]   Culture     Final   Value: NO GROWTH     Performed at [facility]   Report Status 08/17/2013 FINAL   Final     Labs: Basic Metabolic Panel:  Recent Labs Lab 08/16/13 0625 08/17/13 0414 08/18/13 0412  NA 139 139 139  K 3.1* 4.4 3.7  CL 101 100 99  CO2 26 27 30   GLUCOSE 161* 104* 92  BUN 17 22 26*  CREATININE 0.92 1.05 1.13*  CALCIUM 9.2 9.3 9.2   Liver Function Tests: No results found for this basename: AST, ALT, ALKPHOS, BILITOT, PROT, ALBUMIN,  in the last 168 hours No  results found for this basename: LIPASE, AMYLASE,  in the last 168 hours No results found for this basename: AMMONIA,  in the last 168 hours CBC:  Recent Labs Lab 08/16/13 0625 08/17/13 0414 08/18/13 0412  WBC 15.0* 18.2* 11.8*  NEUTROABS 7.4  --   --   HGB 11.7* 10.6* 11.3*  HCT 35.1* 31.5* 33.1*  MCV 90.0 88.7 89.7  PLT 244 241 254   Cardiac Enzymes: No results found for this basename: CKTOTAL, CKMB, CKMBINDEX, TROPONINI,  in the last 168 hours BNP: BNP (last 3 results)  Recent Labs  08/16/13 0522  PROBNP 1830.0*   CBG: No results found for this basename: GLUCAP,  in the last 168 hours     Signed:  Jeralyn Bennett  [facility] 08/18/2013, 10:41 AM

## 2013-08-18 NOTE — Discharge Summary (Signed)
Discharge summary addendum       Please follow up on PT/INR, patient having an INR of 2.91 and PTT of 29.4 on the day of discharge.      I would recommend holding Coumadin therapy this evening, restarting tomorrow and her home dose. PT INR checks to       continue biweekly on Tuesday and Thursday, please report these to patient's primary care provider for Coumadin dosing.

## 2013-08-19 LAB — CULTURE, BLOOD (ROUTINE X 2)

## 2013-08-19 LAB — PROTIME-INR: Prothrombin Time: 26.1 seconds — ABNORMAL HIGH (ref 11.6–15.2)

## 2013-08-19 NOTE — Progress Notes (Signed)
Patient rested quietly through the night. Remains alert to self only.

## 2013-08-19 NOTE — Progress Notes (Signed)
[facility] PROGRESS NOTE  Rebecca Estrada HYQ:[id number] DOB: 31-Aug-1917 DOA: 08/16/2013 PCP: Ginette Otto, MD  Assessment/Plan  Acute hypoxemic respiratory Evidence by an O2 sat of 79% on room air, I suspect secondary to acute decompensated heart failure. Repeat chest x-ray this morning showing no change from yesterday. She has remained afebrile, nontoxic, clinically improved after receiving IV Lasix.  She is presently satting mid 90s on room air..   Acute decompensated heart failure Pending report from the transthoracic echocardiogram performed yesterday evening. Will transition her to oral Lasix at 20 mg  daily. She appears improved on today's examination. He is presently chest pain-free, having a troponin within normal limits.   Urinary tract infection UA consistent with UTI, urine culture showed no growth. Plan to discharge today on Ceftin 250 mg by mouth twice a day.  Leukocytosis Likely secondary to urinary tract infection, patient initially treated with Rocephin 1 g IV every 24 hours, we'll plan to transition to oral Ceftin 200 mg by mouth twice a day  Chronic anticoagulation INR today and 2.49, patient to restart Coumadin at her home dose today. PT/INR checks at her nursing home twice weekly on Tuesdays and Thursdays  Hypokalemia Resolved  Code Status: DO NOT RESUSCITATE Family Communication: I went over plan with patient's daughter present at bedside Disposition Plan: Follow up on urine cultures as well as transthoracic echocardiogram, meanwhile maintain patient on ceftriaxone    Antibiotics:  Ceftriaxone 1 g IV every 24 hours (started on 08/16/2013)  HPI/Subjective: Given significant improvement, patient was discharged yesterday however not accepted to nursing home until this morning. There were no events overnight, she reports doing well. Plan to get her transferred today to the skilled nursing facility.  Objective: Filed Vitals:   08/19/13 0947   BP: 118/64  Pulse: 80  Temp: 99 F (37.2 C)  Resp: 20    Intake/Output Summary (Last 24 hours) at 08/19/13 0955 Last data filed at 08/19/13 0950  Gross per 24 hour  Intake    423 ml  Output      0 ml  Net    423 ml   Filed Weights   08/17/13 0433 08/18/13 0454 08/19/13 0523  Weight: 62.1 kg (136 lb 14.5 oz) 60.34 kg (133 lb 0.4 oz) 59.1 kg (130 lb 4.7 oz)    Exam:   General:  Patient is in no acute distress, awake alert, continues to be confused and disoriented.  Cardiovascular: Regular rate and rhythm 2/6 systolic ejection murmur  Respiratory: Lungs showing the presence of by basilar crackles, there may be some improvement since yesterday's exam  Abdomen: Abdomen is soft nontender nondistended positive bowel sounds  Musculoskeletal: Present range of motion of all extremities  Extremities: 2+ bilateral extremity pitting edema  Data Reviewed: Basic Metabolic Panel:  Recent Labs Lab 08/16/13 0625 08/17/13 0414 08/18/13 0412  NA 139 139 139  K 3.1* 4.4 3.7  CL 101 100 99  CO2 26 27 30   GLUCOSE 161* 104* 92  BUN 17 22 26*  CREATININE 0.92 1.05 1.13*  CALCIUM 9.2 9.3 9.2   Liver Function Tests: No results found for this basename: AST, ALT, ALKPHOS, BILITOT, PROT, ALBUMIN,  in the last 168 hours No results found for this basename: LIPASE, AMYLASE,  in the last 168 hours No results found for this basename: AMMONIA,  in the last 168 hours CBC:  Recent Labs Lab 08/16/13 0625 08/17/13 0414 08/18/13 0412  WBC 15.0* 18.2* 11.8*  NEUTROABS 7.4  --   --  HGB 11.7* 10.6* 11.3*  HCT 35.1* 31.5* 33.1*  MCV 90.0 88.7 89.7  PLT 244 241 254   Cardiac Enzymes: No results found for this basename: CKTOTAL, CKMB, CKMBINDEX, TROPONINI,  in the last 168 hours BNP (last 3 results)  Recent Labs  08/16/13 0522  PROBNP 1830.0*   CBG: No results found for this basename: GLUCAP,  in the last 168 hours  Recent Results (from the past 240 hour(s))  CULTURE, BLOOD  (ROUTINE X 2)     Status: None   Collection Time    08/16/13 12:50 PM      Result Value Range Status   Specimen Description BLOOD LEFT ANTECUBITAL   Final   Special Requests BOTTLES DRAWN AEROBIC ONLY [facility]   Final   Culture  Setup Time     Final   Value: 08/17/2013 00:58     Performed at [facility]   Culture     Final   Value: DIPHTHEROIDS(CORYNEBACTERIUM SPECIES)     Note: Standardized susceptibility testing for this organism is not available.     Note: Gram Stain Report Called to,Read Back By and Verified With: TEENA WOODS RN 08/18/13 @ 1:26PM BY RUSCOE A.     Performed at [facility]   Report Status 08/19/2013 FINAL   Final  CULTURE, BLOOD (ROUTINE X 2)     Status: None   Collection Time    08/16/13 12:55 PM      Result Value Range Status   Specimen Description BLOOD HAND LEFT   Final   Special Requests BOTTLES DRAWN AEROBIC ONLY [facility]   Final   Culture  Setup Time     Final   Value: 08/17/2013 00:59     Performed at [facility]   Culture     Final   Value:        BLOOD CULTURE RECEIVED NO GROWTH TO DATE CULTURE WILL BE HELD FOR 5 DAYS BEFORE ISSUING A FINAL NEGATIVE REPORT     Performed at [facility]   Report Status PENDING   Incomplete  MRSA PCR SCREENING     Status: None   Collection Time    08/16/13  1:16 PM      Result Value Range Status   MRSA by PCR NEGATIVE  NEGATIVE Final   Comment:            The GeneXpert MRSA Assay (FDA     approved for NASAL specimens     only), is one component of a     comprehensive MRSA colonization     surveillance program. It is not     intended to diagnose MRSA     infection nor to guide or     monitor treatment for     MRSA infections.  URINE CULTURE     Status: None   Collection Time    08/16/13  4:25 PM      Result Value Range Status   Specimen Description URINE, RANDOM   Final   Special Requests NONE   Final   Culture  Setup Time     Final   Value: 08/16/2013 17:42     Performed at [facility]     Final   Value: NO GROWTH     Performed at [facility]   Culture     Final   Value: NO GROWTH     Performed at [facility]   Report Status  08/17/2013 FINAL   Final     Studies: Dg Chest 2 View  08/17/2013   *RADIOLOGY REPORT*  Clinical Data: Hypoxemia  CHEST - 2 VIEW  Comparison: Prior radiograph from 08/16/2013.  Findings: Median sternotomy wires remain intact.  Cardiomegaly is stable as compared to the prior exam. Prosthetic valve again noted.  Current examination has been performed with a slightly improved degree of lung inflation.  Coarsening of the interstitial markings is not significantly changed as compared to the prior examination. Linear opacity at the medial right lung apex is unchanged.  There is no frank pulmonary edema. No new airspace consolidation identified.  No pneumothorax.  Osseous structures and soft tissues are unchanged. Surgical clips overlie the right axilla.  IMPRESSION: No significant interval change in the appearance of the heart and lungs with diffuse coarsening of the interstitial markings.  No new airspace consolidation identified.  No frank pulmonary edema.   Original Report Authenticated By: Rise Mu, M.D.    Scheduled Meds: . beta carotene w/minerals  1 tablet Oral Daily  . cefTRIAXone (ROCEPHIN)  IV  1 g Intravenous Q24H  . citalopram  20 mg Oral Daily  . digoxin  0.125 mg Oral Daily  . docusate sodium  200 mg Oral QHS  . furosemide  20 mg Oral Q breakfast  . levothyroxine  125 mcg Oral QAC breakfast  . polyethylene glycol  17 g Oral Daily  . sodium chloride  3 mL Intravenous Q12H   Continuous Infusions:   Active Problems:   Leukocytosis   Dyspnea   Hypoxia   Atrial fibrillation   CHF (congestive heart failure)    Time spent: 35 minutes    Jeralyn Bennett  [facility] Pager [phone]. If 7PM-7AM, please contact night-coverage at [email or web address], password [facility] 08/19/2013, 9:55  AM  LOS: 3 days

## 2013-08-19 NOTE — Discharge Summary (Signed)
Discharge summary addendum  Discharge orders were placed yesterday however patient not accepted to the skilled nursing facility until today. I held yesturday's evening dose of Coumadin  because of the upward trend of her INR to 2.91. Her INR this morning at 2.49 with PT of 26.1 for which I recommend restarting Coumadin today. I recommended twice weekly PT/INR checks on Tuesdays and Thursdays.

## 2013-08-21 NOTE — Clinical Social Work Psychosocial (Signed)
     Clinical Social Work Department BRIEF PSYCHOSOCIAL ASSESSMENT 08/21/2013  Patient:  Rebecca Estrada, Rebecca Estrada     Account Number:  [id number]     Admit date:  08/16/2013  Clinical Social Worker:  Tiburcio Pea  Date/Time:  08/19/2013 03:00 PM  Referred by:  Physician  Date Referred:  08/19/2013 Referred for  Other - See comment   Other Referral:   Return to SNF   Interview type:  Family Other interview type:   Son    PSYCHOSOCIAL DATA Living Status:  FACILITY Admitted from facility:  [facility] Level of care:  Skilled Nursing Facility Primary support name:  Jeannett Senior   [phone] Primary support relationship to patient:  CHILD, ADULT Degree of support available:   Strong support    CURRENT CONCERNS Current Concerns  Post-Acute Placement   Other Concerns:   REturn to facility    SOCIAL WORK ASSESSMENT / PLAN 79 year old female resident of [facility] SNF level of care.  Per MD- patient is stable for return to facility today. CSW notified Tresa Endo at [facility] who confirmed ok for return to SNF today.  OK per patient's son as well. Fl2 completed and MD signed.  DC completed with facility and family.   Assessment/plan status:  No Further Intervention Required Other assessment/ plan:   Information/referral to community resources:   None at this time.    PATIENTS/FAMILYS RESPONSE TO PLAN OF CARE: Patient is alert but confused. She is unable to participate in plan of care. Patient's son was pleased with d/c plan and wants his mother to return to facility today. Nursing notified to call report.  DC completed with call to EMS. No further CSW needs identified. CSW signing off.  Lorri Frederick. Abigaile Rossie, LCSWA  [phone]

## 2013-08-23 LAB — CULTURE, BLOOD (ROUTINE X 2)

## 2015-05-15 IMAGING — CR DG CHEST 2V
2 series · 2 of 2 positions shown · non-contrast
Comparison: Prior radiograph from 08/16/2013.

CLINICAL DATA: Hypoxemia

CHEST - 2 VIEW

[w chest lat]
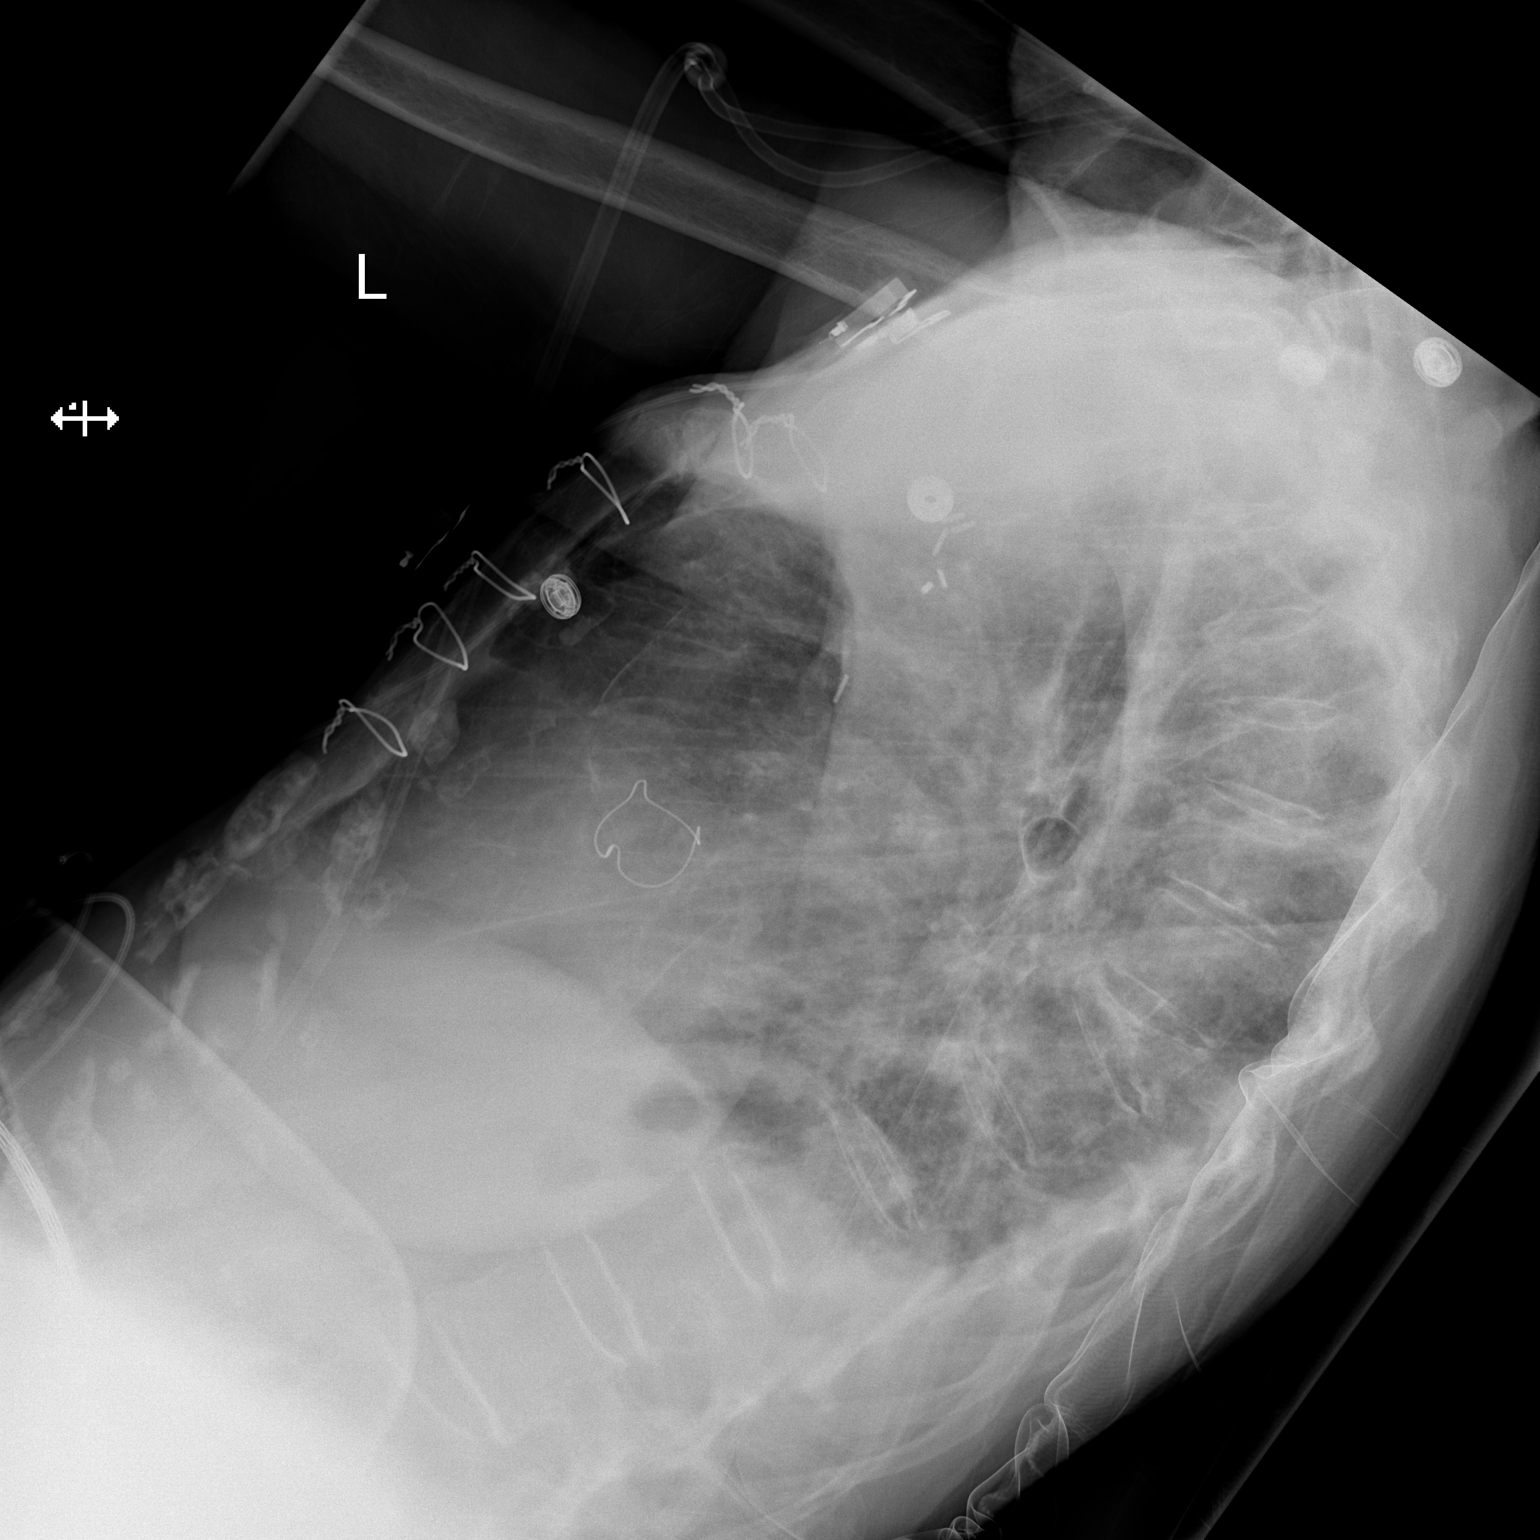

[x chest ap]
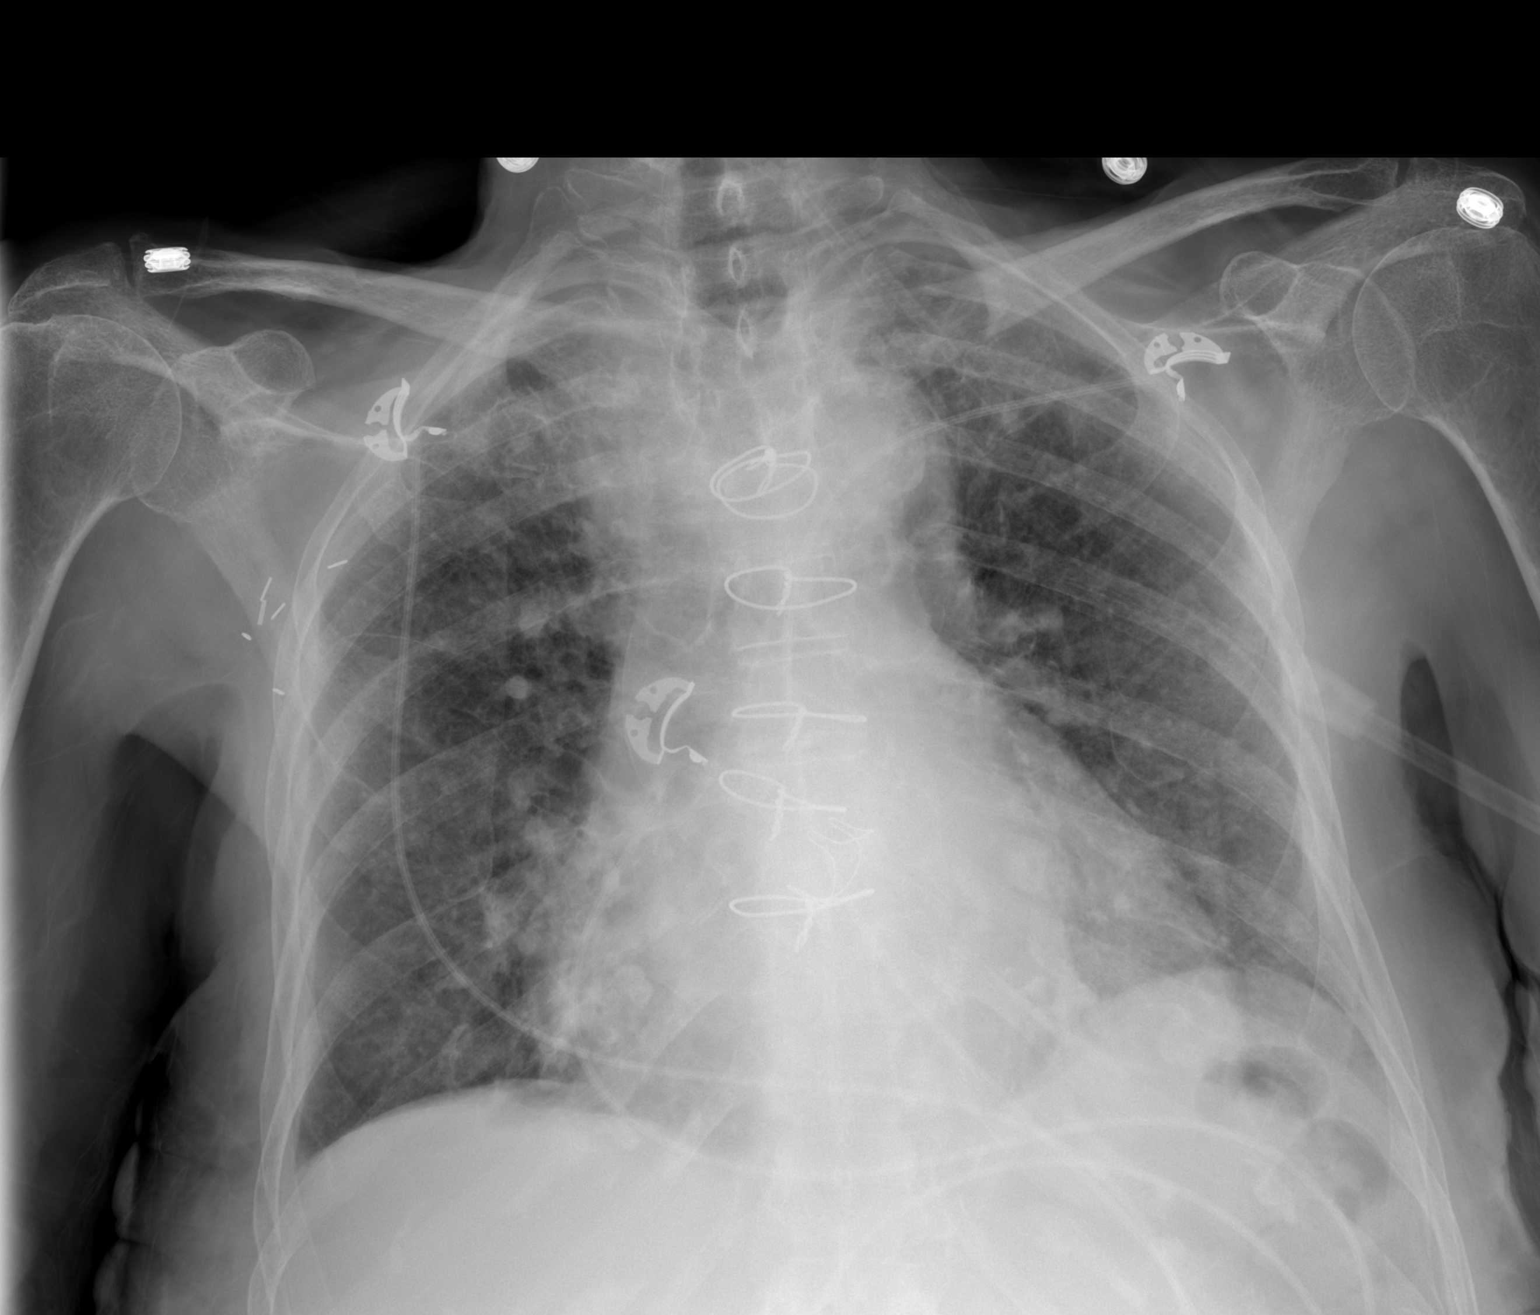

[2 of 2 positions shown; findings below may reference images not displayed]

FINDINGS: Median sternotomy wires remain intact.  Cardiomegaly is
stable as compared to the prior exam. Prosthetic valve again noted.

Current examination has been performed with a slightly improved
degree of lung inflation.  Coarsening of the interstitial markings
is not significantly changed as compared to the prior examination.
Linear opacity at the medial right lung apex is unchanged.

There is no frank pulmonary edema. No new airspace consolidation
identified.  No pneumothorax.

Osseous structures and soft tissues are unchanged. Surgical clips
overlie the right axilla.
IMPRESSION: No significant interval change in the appearance of the heart and
lungs with diffuse coarsening of the interstitial markings.  No new
airspace consolidation identified.  No frank pulmonary edema.

## 2015-12-15 NOTE — Progress Notes (Signed)
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   Rebecca Estrada presented with the following signs/symptoms 2 weeks before the biopsy by her Nursing home staff. A lesion that was bleeding at times.  Past/Anticipated interventions by patient's surgeon/dermatologist for current problematic lesion, if any: Biopsy 12/04/15 Skin biopsy Right superior lateral anterior neck, shave & ED&C revealed poorly differentiated squamous cell carcinoma with focal small cell component.   Past skin cancers, if any: Basal Cell Carcinoma: None  History of Blistering sunburns, if any:   SAFETY ISSUES:  Prior radiation? No  Pacemaker/ICD? No  Possible current pregnancy? No  Is the patient on methotrexate? No  Current Complaints / other details:   She is a resident of [facility]. She is a total care patient there, and has a diagnosis of Alzheimers. Her daughter [name] is here with her today.   BP 114/74 mmHg  Pulse 141  Temp(Src) 98.6 F (37 C)  Wt

## 2015-12-16 ENCOUNTER — Ambulatory Visit
Admission: RE | Admit: 2015-12-16 | Discharge: 2015-12-16 | Disposition: A | Discharge status: Home / Self Care | Payer: Medicare Other | Source: Ambulatory Visit | Attending: Radiation Oncology | Admitting: Radiation Oncology

## 2015-12-16 ENCOUNTER — Encounter: Payer: Self-pay | Admitting: Radiation Oncology

## 2015-12-16 VITALS — BP 114/74 | HR 141 | Temp 98.6°F

## 2015-12-16 DIAGNOSIS — C4442 Squamous cell carcinoma of skin of scalp and neck: Secondary | ICD-10-CM | POA: Diagnosis not present

## 2015-12-16 DIAGNOSIS — I4891 Unspecified atrial fibrillation: Secondary | ICD-10-CM | POA: Insufficient documentation

## 2015-12-16 DIAGNOSIS — H3530 Unspecified macular degeneration: Secondary | ICD-10-CM | POA: Diagnosis not present

## 2015-12-16 DIAGNOSIS — G309 Alzheimer's disease, unspecified: Secondary | ICD-10-CM | POA: Insufficient documentation

## 2015-12-16 DIAGNOSIS — E7800 Pure hypercholesterolemia, unspecified: Secondary | ICD-10-CM | POA: Diagnosis not present

## 2015-12-16 DIAGNOSIS — E039 Hypothyroidism, unspecified: Secondary | ICD-10-CM | POA: Insufficient documentation

## 2015-12-16 DIAGNOSIS — I447 Left bundle-branch block, unspecified: Secondary | ICD-10-CM | POA: Insufficient documentation

## 2015-12-16 HISTORY — DX: Squamous cell carcinoma of skin of scalp and neck: C44.42

## 2015-12-16 NOTE — Progress Notes (Signed)
Radiation Oncology         [phone] ________________________________  Initial outpatient Consultation  Name: Rebecca Estrada MRN: [id number]  Date: 12/16/2015  DOB: [birth year]  [id number] Marcello Moores, MD  Sydnee Levans, MD   REFERRING PHYSICIAN: Sydnee Levans, MD  DIAGNOSIS: Stage I squamous cell carcinoma of the skin, poorly differentiated, with focal small cell features    ICD-9-CM ICD-10-CM   1. Squamous cell carcinoma of skin of neck 173.42 C44.42      HISTORY OF PRESENT ILLNESS::Rebecca Estrada is a 81 y.o. female who presented with 2 weeks before the biopsy by her Nursing home staff with a lesion on her right neck. It was noted to be bleeding at times.  Past/Anticipated interventions by patient's surgeon/dermatologist for current problematic lesion, if any: Biopsy 12/04/15 (it was "scraped off", per daughter, due to the fact that it was not anticipated to have such aggressive histology.) Skin biopsy Right superior lateral anterior neck, shave & ED&C revealed poorly differentiated squamous cell carcinoma with focal small cell component.    SAFETY ISSUES:  Prior radiation? No  Pacemaker/ICD? No  Possible current pregnancy? No  Is the patient on methotrexate? No  Current Complaints / other details:   She is a resident of [facility]. She is a total care patient there, and has a diagnosis of Alzheimers. Her daughter Rebecca Estrada is here with her today.     PREVIOUS RADIATION THERAPY: No  PAST MEDICAL HISTORY:  has a past medical history of Hypercholesteremia; Atrial fibrillation ([place]); Left bundle branch block; Macular degeneration of left eye; Actinic keratitis; Cancer ([place]); Hypothyroidism; Hypoxia (08/16/2013); and Squamous cell carcinoma of neck ([place]).    PAST SURGICAL HISTORY: Past Surgical History  Procedure Laterality Date  . Mastectomy    . Percutaneous pinning femoral neck fracture    . Valve replacement  05/19/1995    FAMILY  HISTORY: family history includes Diabetes in her father.  SOCIAL HISTORY:  reports that she has never smoked. She has never used smokeless tobacco. She reports that she does not drink alcohol or use illicit drugs.  ALLERGIES: Review of patient's allergies indicates no known allergies.  MEDICATIONS:  Current Outpatient Prescriptions  Medication Sig Dispense Refill  . acetaminophen (TYLENOL) 500 MG tablet Take 500 mg by mouth every 6 (six) hours as needed for pain.    . beta carotene w/minerals (OCUVITE) tablet Take 1 tablet by mouth daily.    . Calcium Carbonate (OS-CAL PO) Take 1 tablet by mouth daily.    . cefUROXime (CEFTIN) 250 MG tablet Take 1 tablet (250 mg total) by mouth 2 (two) times daily. 8 tablet 0  . citalopram (CELEXA) 20 MG tablet Take 20 mg by mouth daily.    . Cranberry-Vitamin C-Inulin (UTI-STAT) LIQD Take 30 mLs by mouth daily.    . digoxin (LANOXIN) 0.125 MG tablet Take 0.125 mg by mouth daily.    Marland Kitchen docusate sodium (COLACE) 100 MG capsule Take 200 mg by mouth at bedtime.    . furosemide (LASIX) 20 MG tablet Take 1 tablet (20 mg total) by mouth daily. 4 tablet 0  . levothyroxine (SYNTHROID, LEVOTHROID) 125 MCG tablet Take 125 mcg by mouth daily before breakfast.    . polyethylene glycol (MIRALAX / GLYCOLAX) packet Take 17 g by mouth daily.    Marland Kitchen warfarin (COUMADIN) 3 MG tablet Take 3 mg by mouth every other day.    . warfarin (COUMADIN) 4 MG tablet Take 4 mg by mouth every other day.  No current facility-administered medications for this encounter.    REVIEW OF SYSTEMS:  Notable for that above.   PHYSICAL EXAM:  temperature is 98.6 F (37 C). Her blood pressure is 114/74 and her pulse is 141.   General: pleasant, sleepy, cognitive limitations,  confusion HEENT:  Oropharynx without thrush Neck: Neck is supple, no palpable cervical or supraclavicular lymphadenopathy. Skin lesion where biopsy was done - open superficial wound 1-1.5cm in level 2/3 region of right neck.  No sign of recurrence Heart: tachycardic Chest: Clear to auscultation bilaterally - limited respiratory effort Abdomen: Soft, nontender, nondistended Extremities:++ ankle edema. Lymphatics: see Neck Exam Skin: see neck exam Musculoskeletal: in wheelchair Psychiatric: cognitive limitations,  confusion.   ECOG = 3  0 - Asymptomatic (Fully active, able to carry on all predisease activities without restriction)  1 - Symptomatic but completely ambulatory (Restricted in physically strenuous activity but ambulatory and able to carry out work of a light or sedentary nature. For example, light housework, office work)  2 - Symptomatic, <50% in bed during the day (Ambulatory and capable of all self care but unable to carry out any work activities. Up and about more than 50% of waking hours)  3 - Symptomatic, >50% in bed, but not bedbound (Capable of only limited self-care, confined to bed or chair 50% or more of waking hours)  4 - Bedbound (Completely disabled. Cannot carry on any self-care. Totally confined to bed or chair)  5 - Death   Eustace Pen MM, Creech RH, Tormey DC, et al. [phone]). "Toxicity and response criteria of the [facility]". [place] Oncol. 5 (6): 649-55   LABORATORY DATA:  Lab Results  Component Value Date   WBC 11.8* 08/18/2013   HGB 11.3* 08/18/2013   HCT 33.1* 08/18/2013   MCV 89.7 08/18/2013   PLT 254 08/18/2013   CMP     Component Value Date/Time   NA 139 08/18/2013 0412   K 3.7 08/18/2013 0412   CL 99 08/18/2013 0412   CO2 30 08/18/2013 0412   GLUCOSE 92 08/18/2013 0412   BUN 26* 08/18/2013 0412   CREATININE 1.13* 08/18/2013 0412   CALCIUM 9.2 08/18/2013 0412   GFRNONAA 40* 08/18/2013 0412   GFRAA 47* 08/18/2013 0412         RADIOGRAPHY: No results found.    IMPRESSION/PLAN: Today, I talked to the patient and her daughter about the findings and work-up thus far. I spoke with her dermatologist by phone during the consult.  Her  dermatologist has significant concerns of recurrence based on the technique of biopsy and the histology.  The surgical option for cure is Mohs, but the Mohs surgeon in the practice is not strongly inclined to pursue this for a 81 year old patient.  Cryotherapy or other topical treatments are not options.    We discussed the patient's diagnosis of skin cancer and general treatment for this, highlighting the role of radiotherapy in the management. We discussed the available radiation techniques, and focused on the details of logistics and delivery.  I would recommend  44Gy in 10 fractions given twice a week.  We discussed the risks, benefits, and side effects of radiotherapy. Side effects may include but not necessarily be limited to: skin irritation, vessel injury, fatigue.  No guarantees of treatment were given. A consent form was signed and placed in the patient's medical record. They were  encouraged to ask questions that I answered to the best of my ability.   Daughter  will confer with family and call our clinic if they chose to pursue treatment.  I gave her my contact information. I will not schedule anything, otherwise.    __________________________________________   Eppie Gibson, MD

## 2015-12-17 ENCOUNTER — Encounter: Payer: Self-pay | Admitting: Radiation Oncology

## 2015-12-17 DIAGNOSIS — C4442 Squamous cell carcinoma of skin of scalp and neck: Secondary | ICD-10-CM | POA: Insufficient documentation

## 2016-03-19 DEATH — deceased
